# Patient Record
Sex: Female | Born: 1940
Health system: Southern US, Community
[De-identification: ages and names within clinical notes are randomized; demographics above are authoritative.]

## PROBLEM LIST (undated history)

## (undated) DIAGNOSIS — I10 Essential (primary) hypertension: Secondary | ICD-10-CM

## (undated) DIAGNOSIS — N183 Chronic kidney disease, stage 3 unspecified: Secondary | ICD-10-CM

## (undated) DIAGNOSIS — E119 Type 2 diabetes mellitus without complications: Secondary | ICD-10-CM

## (undated) DIAGNOSIS — J302 Other seasonal allergic rhinitis: Secondary | ICD-10-CM

## (undated) DIAGNOSIS — E7800 Pure hypercholesterolemia, unspecified: Secondary | ICD-10-CM

## (undated) DIAGNOSIS — R011 Cardiac murmur, unspecified: Secondary | ICD-10-CM

## (undated) DIAGNOSIS — N200 Calculus of kidney: Secondary | ICD-10-CM

## (undated) DIAGNOSIS — I350 Nonrheumatic aortic (valve) stenosis: Secondary | ICD-10-CM

## (undated) DIAGNOSIS — R87629 Unspecified abnormal cytological findings in specimens from vagina: Secondary | ICD-10-CM

## (undated) HISTORY — DX: Cardiac murmur, unspecified: R01.1

## (undated) HISTORY — DX: Calculus of kidney: N20.0

## (undated) HISTORY — DX: Type 2 diabetes mellitus without complications: E11.9

## (undated) HISTORY — DX: Unspecified abnormal cytological findings in specimens from vagina: R87.629

## (undated) HISTORY — DX: Other seasonal allergic rhinitis: J30.2

## (undated) HISTORY — DX: Chronic kidney disease, stage 3 unspecified: N18.30

## (undated) HISTORY — DX: Essential (primary) hypertension: I10

## (undated) HISTORY — DX: Chronic kidney disease, stage 3 (moderate): N18.3

## (undated) HISTORY — DX: Nonrheumatic aortic (valve) stenosis: I35.0

## (undated) HISTORY — DX: Pure hypercholesterolemia, unspecified: E78.00

---

## 1998-11-22 ENCOUNTER — Other Ambulatory Visit: Admission: RE | Admit: 1998-11-22 | Discharge: 1998-11-22 | Payer: Self-pay | Admitting: *Deleted

## 1999-06-28 ENCOUNTER — Other Ambulatory Visit: Admission: RE | Admit: 1999-06-28 | Discharge: 1999-06-28 | Payer: Self-pay | Admitting: *Deleted

## 2000-08-08 ENCOUNTER — Other Ambulatory Visit: Admission: RE | Admit: 2000-08-08 | Discharge: 2000-08-08 | Payer: Self-pay | Admitting: *Deleted

## 2002-05-05 ENCOUNTER — Other Ambulatory Visit: Admission: RE | Admit: 2002-05-05 | Discharge: 2002-05-05 | Payer: Self-pay | Admitting: *Deleted

## 2003-05-27 ENCOUNTER — Other Ambulatory Visit: Admission: RE | Admit: 2003-05-27 | Discharge: 2003-05-27 | Payer: Self-pay | Admitting: *Deleted

## 2004-07-24 ENCOUNTER — Encounter: Admission: RE | Admit: 2004-07-24 | Discharge: 2004-10-22 | Payer: Self-pay | Admitting: Family Medicine

## 2004-10-12 ENCOUNTER — Other Ambulatory Visit: Admission: RE | Admit: 2004-10-12 | Discharge: 2004-10-12 | Payer: Self-pay | Admitting: Family Medicine

## 2004-11-30 ENCOUNTER — Encounter: Admission: RE | Admit: 2004-11-30 | Discharge: 2005-02-28 | Payer: Self-pay | Admitting: Family Medicine

## 2008-09-19 ENCOUNTER — Emergency Department (HOSPITAL_BASED_OUTPATIENT_CLINIC_OR_DEPARTMENT_OTHER): Admission: EM | Admit: 2008-09-19 | Discharge: 2008-09-19 | Payer: Self-pay | Admitting: Emergency Medicine

## 2008-09-19 ENCOUNTER — Ambulatory Visit: Payer: Self-pay | Admitting: Interventional Radiology

## 2008-09-19 IMAGING — CR DG TIBIA/FIBULA 2V*L*
4 series · 4 of 4 positions shown · non-contrast
Comparison: None

CLINICAL DATA: Fell, leg pain

LEFT TIBIA AND FIBULA - 2 VIEW

[t tib/fib ap left (1 of 2)]
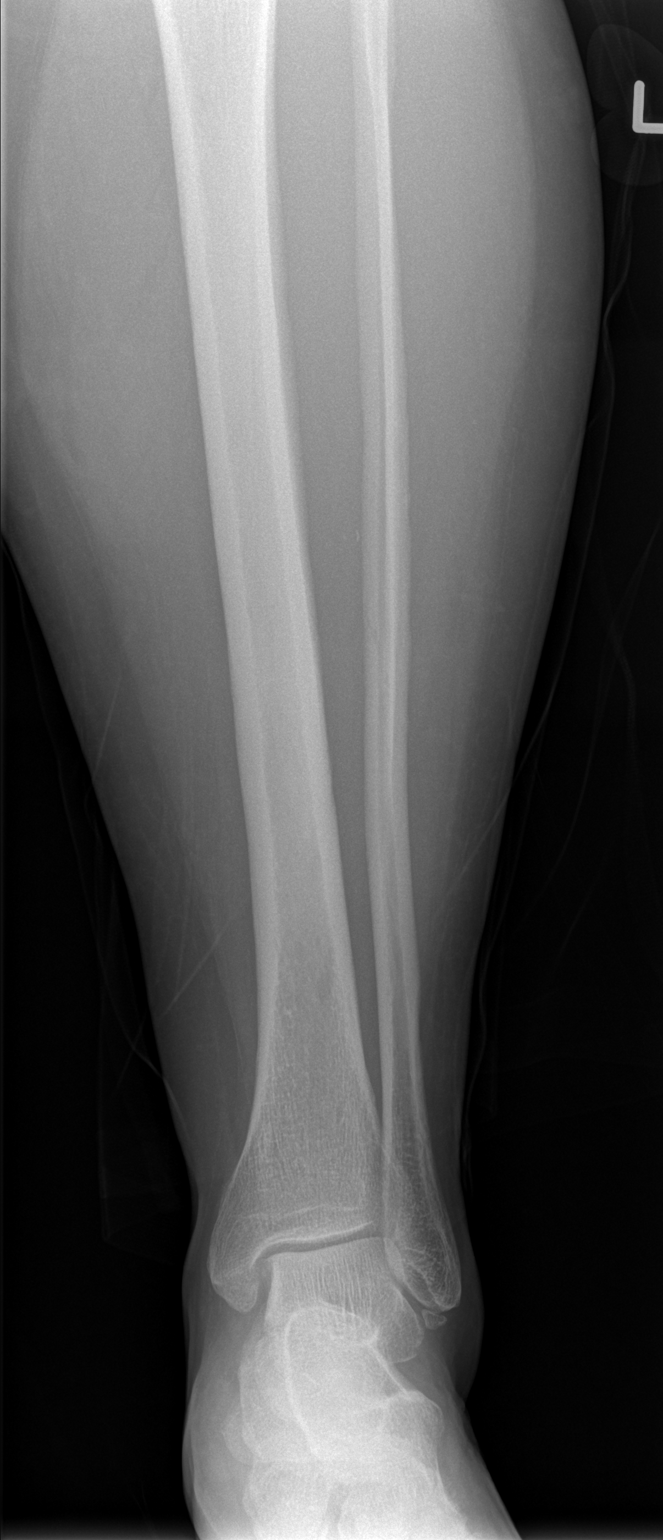

[t tib/fib ap left (2 of 2)]
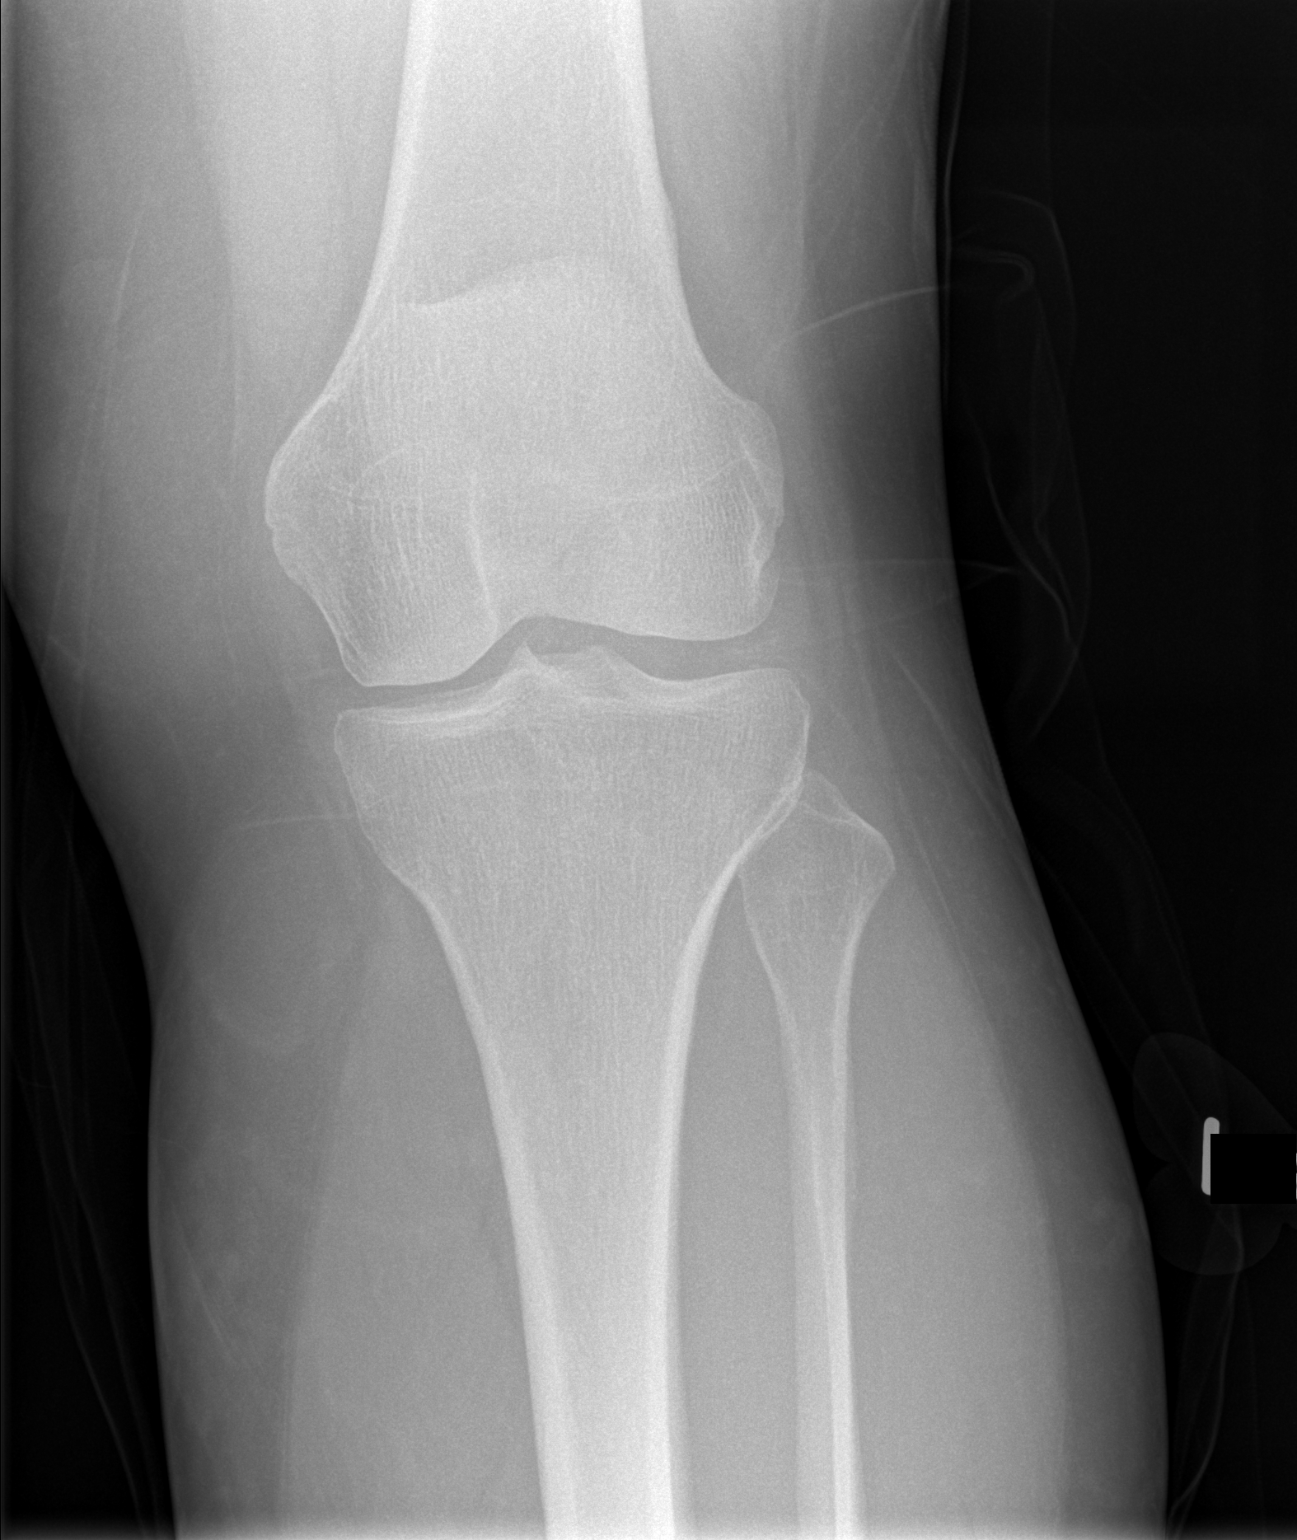

[t tib/fib lat left (1 of 2)]
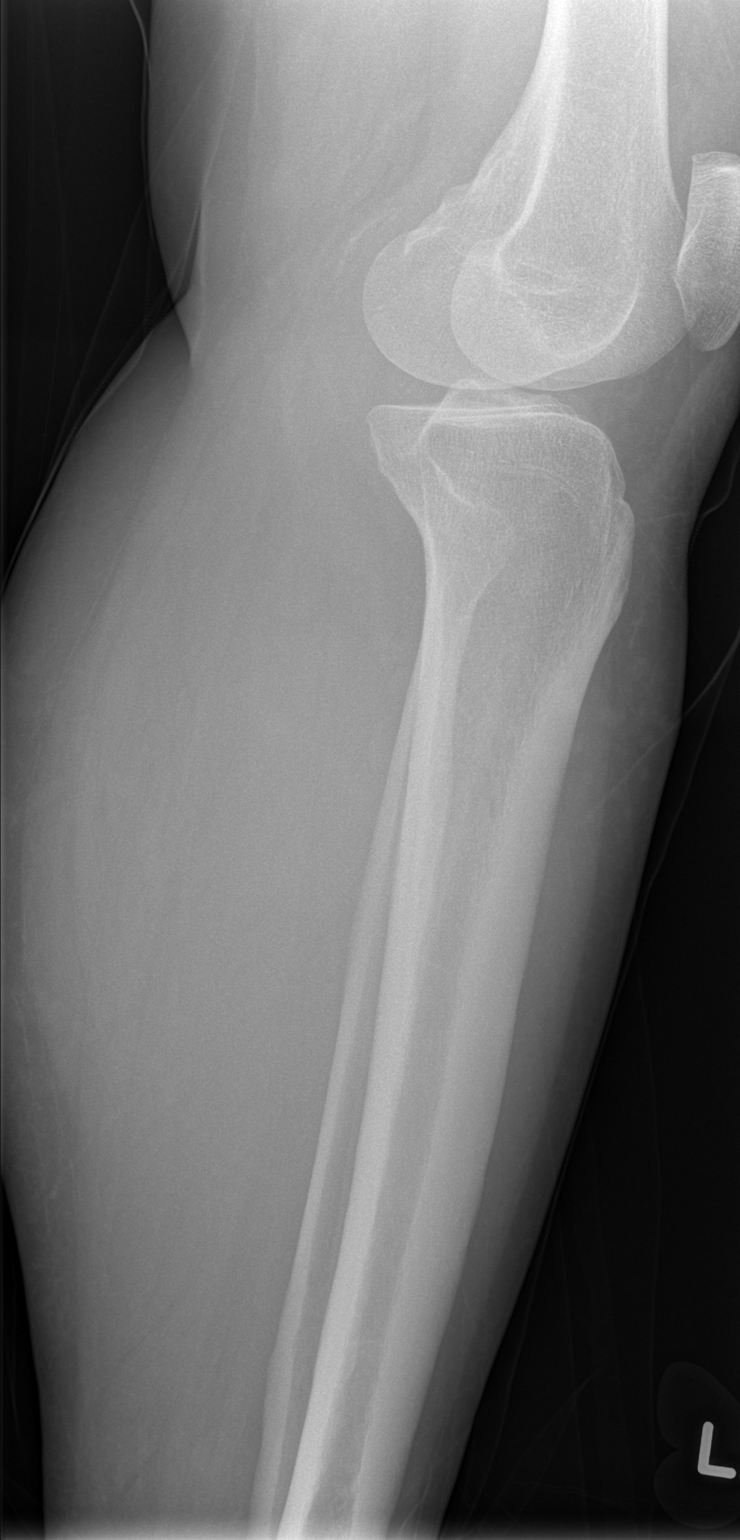

[t tib/fib lat left (2 of 2)]
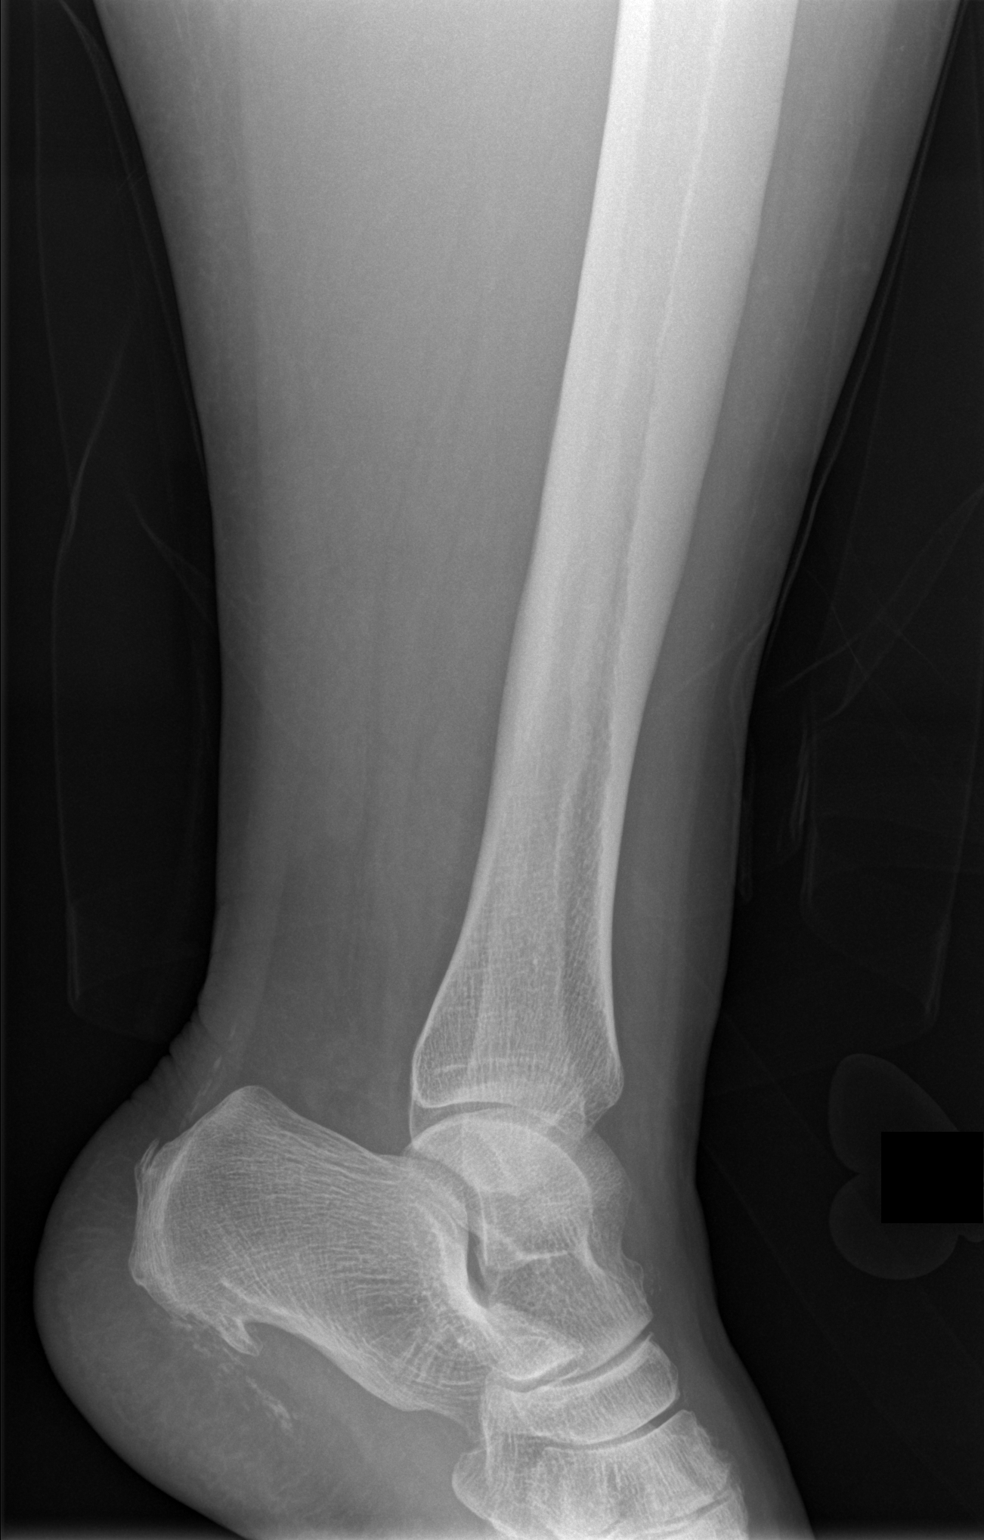

[4 of 4 positions shown; findings below may reference images not displayed]

FINDINGS: There is a corticated ossicle projecting inferior to the
lateral malleolus.  Calcaneal spurs at the plantar aponeurosis and
Achilles tendon with adjacent dystrophic soft tissue
calcifications. Negative for fracture, dislocation, or other acute
abnormality.  Normal alignment and mineralization.
IMPRESSION: Negative for fracture or acute abnormality.

## 2014-08-03 DIAGNOSIS — L659 Nonscarring hair loss, unspecified: Secondary | ICD-10-CM | POA: Diagnosis not present

## 2014-08-03 DIAGNOSIS — E1165 Type 2 diabetes mellitus with hyperglycemia: Secondary | ICD-10-CM | POA: Diagnosis not present

## 2014-08-03 DIAGNOSIS — J309 Allergic rhinitis, unspecified: Secondary | ICD-10-CM | POA: Diagnosis not present

## 2014-08-03 DIAGNOSIS — N200 Calculus of kidney: Secondary | ICD-10-CM | POA: Diagnosis not present

## 2014-08-03 DIAGNOSIS — N183 Chronic kidney disease, stage 3 (moderate): Secondary | ICD-10-CM | POA: Diagnosis not present

## 2014-08-03 DIAGNOSIS — E780 Pure hypercholesterolemia: Secondary | ICD-10-CM | POA: Diagnosis not present

## 2014-08-03 DIAGNOSIS — B351 Tinea unguium: Secondary | ICD-10-CM | POA: Diagnosis not present

## 2014-08-03 DIAGNOSIS — I10 Essential (primary) hypertension: Secondary | ICD-10-CM | POA: Diagnosis not present

## 2014-10-24 DIAGNOSIS — Z0100 Encounter for examination of eyes and vision without abnormal findings: Secondary | ICD-10-CM | POA: Diagnosis not present

## 2014-10-24 DIAGNOSIS — H2513 Age-related nuclear cataract, bilateral: Secondary | ICD-10-CM | POA: Diagnosis not present

## 2014-10-24 DIAGNOSIS — H5210 Myopia, unspecified eye: Secondary | ICD-10-CM | POA: Diagnosis not present

## 2014-10-27 DIAGNOSIS — Z0100 Encounter for examination of eyes and vision without abnormal findings: Secondary | ICD-10-CM | POA: Diagnosis not present

## 2014-11-02 DIAGNOSIS — N183 Chronic kidney disease, stage 3 (moderate): Secondary | ICD-10-CM | POA: Diagnosis not present

## 2014-11-02 DIAGNOSIS — E119 Type 2 diabetes mellitus without complications: Secondary | ICD-10-CM | POA: Diagnosis not present

## 2014-11-02 DIAGNOSIS — E780 Pure hypercholesterolemia: Secondary | ICD-10-CM | POA: Diagnosis not present

## 2014-11-02 DIAGNOSIS — I10 Essential (primary) hypertension: Secondary | ICD-10-CM | POA: Diagnosis not present

## 2015-02-01 DIAGNOSIS — I10 Essential (primary) hypertension: Secondary | ICD-10-CM | POA: Diagnosis not present

## 2015-02-01 DIAGNOSIS — E119 Type 2 diabetes mellitus without complications: Secondary | ICD-10-CM | POA: Diagnosis not present

## 2015-02-01 DIAGNOSIS — N183 Chronic kidney disease, stage 3 (moderate): Secondary | ICD-10-CM | POA: Diagnosis not present

## 2015-02-01 DIAGNOSIS — E780 Pure hypercholesterolemia: Secondary | ICD-10-CM | POA: Diagnosis not present

## 2015-03-05 DIAGNOSIS — Z23 Encounter for immunization: Secondary | ICD-10-CM | POA: Diagnosis not present

## 2015-05-05 DIAGNOSIS — Z1231 Encounter for screening mammogram for malignant neoplasm of breast: Secondary | ICD-10-CM | POA: Diagnosis not present

## 2015-08-05 DIAGNOSIS — E119 Type 2 diabetes mellitus without complications: Secondary | ICD-10-CM | POA: Diagnosis not present

## 2015-08-05 DIAGNOSIS — I10 Essential (primary) hypertension: Secondary | ICD-10-CM | POA: Diagnosis not present

## 2015-08-05 DIAGNOSIS — E7800 Pure hypercholesterolemia, unspecified: Secondary | ICD-10-CM | POA: Diagnosis not present

## 2015-08-05 DIAGNOSIS — L659 Nonscarring hair loss, unspecified: Secondary | ICD-10-CM | POA: Diagnosis not present

## 2015-08-05 DIAGNOSIS — R011 Cardiac murmur, unspecified: Secondary | ICD-10-CM | POA: Diagnosis not present

## 2015-08-08 ENCOUNTER — Other Ambulatory Visit (HOSPITAL_COMMUNITY): Payer: Self-pay | Admitting: Family Medicine

## 2015-08-08 DIAGNOSIS — R011 Cardiac murmur, unspecified: Secondary | ICD-10-CM

## 2015-08-12 ENCOUNTER — Ambulatory Visit (HOSPITAL_COMMUNITY): Payer: Commercial Managed Care - HMO | Attending: Internal Medicine

## 2015-08-12 ENCOUNTER — Other Ambulatory Visit: Payer: Self-pay

## 2015-08-12 DIAGNOSIS — I10 Essential (primary) hypertension: Secondary | ICD-10-CM | POA: Insufficient documentation

## 2015-08-12 DIAGNOSIS — I517 Cardiomegaly: Secondary | ICD-10-CM | POA: Insufficient documentation

## 2015-08-12 DIAGNOSIS — R011 Cardiac murmur, unspecified: Secondary | ICD-10-CM

## 2015-08-12 DIAGNOSIS — E785 Hyperlipidemia, unspecified: Secondary | ICD-10-CM | POA: Diagnosis not present

## 2015-08-12 DIAGNOSIS — I340 Nonrheumatic mitral (valve) insufficiency: Secondary | ICD-10-CM | POA: Diagnosis not present

## 2015-08-12 DIAGNOSIS — I358 Other nonrheumatic aortic valve disorders: Secondary | ICD-10-CM | POA: Diagnosis not present

## 2015-08-19 ENCOUNTER — Other Ambulatory Visit (HOSPITAL_COMMUNITY): Payer: Self-pay

## 2015-11-11 DIAGNOSIS — E7800 Pure hypercholesterolemia, unspecified: Secondary | ICD-10-CM | POA: Diagnosis not present

## 2016-02-03 DIAGNOSIS — I10 Essential (primary) hypertension: Secondary | ICD-10-CM | POA: Diagnosis not present

## 2016-02-03 DIAGNOSIS — E7800 Pure hypercholesterolemia, unspecified: Secondary | ICD-10-CM | POA: Diagnosis not present

## 2016-02-03 DIAGNOSIS — N183 Chronic kidney disease, stage 3 (moderate): Secondary | ICD-10-CM | POA: Diagnosis not present

## 2016-02-03 DIAGNOSIS — E119 Type 2 diabetes mellitus without complications: Secondary | ICD-10-CM | POA: Diagnosis not present

## 2016-02-03 DIAGNOSIS — Z23 Encounter for immunization: Secondary | ICD-10-CM | POA: Diagnosis not present

## 2016-05-07 DIAGNOSIS — Z1231 Encounter for screening mammogram for malignant neoplasm of breast: Secondary | ICD-10-CM | POA: Diagnosis not present

## 2016-08-08 DIAGNOSIS — E7800 Pure hypercholesterolemia, unspecified: Secondary | ICD-10-CM | POA: Diagnosis not present

## 2016-08-08 DIAGNOSIS — I10 Essential (primary) hypertension: Secondary | ICD-10-CM | POA: Diagnosis not present

## 2016-08-08 DIAGNOSIS — E119 Type 2 diabetes mellitus without complications: Secondary | ICD-10-CM | POA: Diagnosis not present

## 2016-11-02 DIAGNOSIS — K625 Hemorrhage of anus and rectum: Secondary | ICD-10-CM | POA: Diagnosis not present

## 2016-12-22 DIAGNOSIS — J069 Acute upper respiratory infection, unspecified: Secondary | ICD-10-CM | POA: Diagnosis not present

## 2017-02-12 DIAGNOSIS — I10 Essential (primary) hypertension: Secondary | ICD-10-CM | POA: Diagnosis not present

## 2017-02-12 DIAGNOSIS — E119 Type 2 diabetes mellitus without complications: Secondary | ICD-10-CM | POA: Diagnosis not present

## 2017-02-12 DIAGNOSIS — N183 Chronic kidney disease, stage 3 (moderate): Secondary | ICD-10-CM | POA: Diagnosis not present

## 2017-02-12 DIAGNOSIS — E7800 Pure hypercholesterolemia, unspecified: Secondary | ICD-10-CM | POA: Diagnosis not present

## 2017-04-28 DIAGNOSIS — M5489 Other dorsalgia: Secondary | ICD-10-CM | POA: Diagnosis not present

## 2017-05-25 DIAGNOSIS — M25641 Stiffness of right hand, not elsewhere classified: Secondary | ICD-10-CM | POA: Diagnosis not present

## 2017-06-14 DIAGNOSIS — L659 Nonscarring hair loss, unspecified: Secondary | ICD-10-CM | POA: Diagnosis not present

## 2017-06-14 DIAGNOSIS — Z1231 Encounter for screening mammogram for malignant neoplasm of breast: Secondary | ICD-10-CM | POA: Diagnosis not present

## 2017-06-14 DIAGNOSIS — L308 Other specified dermatitis: Secondary | ICD-10-CM | POA: Diagnosis not present

## 2017-06-28 DIAGNOSIS — L308 Other specified dermatitis: Secondary | ICD-10-CM | POA: Diagnosis not present

## 2017-06-28 DIAGNOSIS — M79642 Pain in left hand: Secondary | ICD-10-CM | POA: Diagnosis not present

## 2017-06-28 DIAGNOSIS — M79641 Pain in right hand: Secondary | ICD-10-CM | POA: Diagnosis not present

## 2017-07-18 ENCOUNTER — Telehealth: Payer: Self-pay

## 2017-07-18 DIAGNOSIS — M545 Low back pain: Secondary | ICD-10-CM | POA: Diagnosis not present

## 2017-07-18 DIAGNOSIS — M79641 Pain in right hand: Secondary | ICD-10-CM | POA: Diagnosis not present

## 2017-07-18 DIAGNOSIS — L309 Dermatitis, unspecified: Secondary | ICD-10-CM | POA: Diagnosis not present

## 2017-07-18 DIAGNOSIS — M79642 Pain in left hand: Secondary | ICD-10-CM | POA: Diagnosis not present

## 2017-07-18 DIAGNOSIS — R0789 Other chest pain: Secondary | ICD-10-CM | POA: Diagnosis not present

## 2017-07-18 NOTE — Telephone Encounter (Signed)
Sent notes to scheduling 

## 2017-07-22 ENCOUNTER — Other Ambulatory Visit: Payer: Self-pay | Admitting: Family Medicine

## 2017-07-22 ENCOUNTER — Ambulatory Visit
Admission: RE | Admit: 2017-07-22 | Discharge: 2017-07-22 | Disposition: A | Discharge status: Home / Self Care | Payer: Commercial Managed Care - HMO | Source: Ambulatory Visit | Attending: Family Medicine | Admitting: Family Medicine

## 2017-07-22 ENCOUNTER — Telehealth: Payer: Self-pay

## 2017-07-22 DIAGNOSIS — M79642 Pain in left hand: Secondary | ICD-10-CM

## 2017-07-22 DIAGNOSIS — M5136 Other intervertebral disc degeneration, lumbar region: Secondary | ICD-10-CM | POA: Diagnosis not present

## 2017-07-22 DIAGNOSIS — M545 Low back pain, unspecified: Secondary | ICD-10-CM

## 2017-07-22 DIAGNOSIS — M189 Osteoarthritis of first carpometacarpal joint, unspecified: Secondary | ICD-10-CM | POA: Diagnosis not present

## 2017-07-22 DIAGNOSIS — M19041 Primary osteoarthritis, right hand: Secondary | ICD-10-CM | POA: Diagnosis not present

## 2017-07-22 DIAGNOSIS — M79641 Pain in right hand: Secondary | ICD-10-CM

## 2017-07-22 NOTE — Telephone Encounter (Signed)
SENT REFERRAL TO SCHEDULING 

## 2017-07-25 DIAGNOSIS — M7099 Unspecified soft tissue disorder related to use, overuse and pressure multiple sites: Secondary | ICD-10-CM | POA: Diagnosis not present

## 2017-07-25 DIAGNOSIS — M545 Low back pain: Secondary | ICD-10-CM | POA: Diagnosis not present

## 2017-07-25 DIAGNOSIS — R262 Difficulty in walking, not elsewhere classified: Secondary | ICD-10-CM | POA: Diagnosis not present

## 2017-07-25 DIAGNOSIS — R293 Abnormal posture: Secondary | ICD-10-CM | POA: Diagnosis not present

## 2017-07-30 DIAGNOSIS — R293 Abnormal posture: Secondary | ICD-10-CM | POA: Diagnosis not present

## 2017-07-30 DIAGNOSIS — M545 Low back pain: Secondary | ICD-10-CM | POA: Diagnosis not present

## 2017-07-30 DIAGNOSIS — M7099 Unspecified soft tissue disorder related to use, overuse and pressure multiple sites: Secondary | ICD-10-CM | POA: Diagnosis not present

## 2017-07-30 DIAGNOSIS — R262 Difficulty in walking, not elsewhere classified: Secondary | ICD-10-CM | POA: Diagnosis not present

## 2017-08-01 DIAGNOSIS — M545 Low back pain: Secondary | ICD-10-CM | POA: Diagnosis not present

## 2017-08-01 DIAGNOSIS — M7099 Unspecified soft tissue disorder related to use, overuse and pressure multiple sites: Secondary | ICD-10-CM | POA: Diagnosis not present

## 2017-08-01 DIAGNOSIS — R262 Difficulty in walking, not elsewhere classified: Secondary | ICD-10-CM | POA: Diagnosis not present

## 2017-08-01 DIAGNOSIS — R293 Abnormal posture: Secondary | ICD-10-CM | POA: Diagnosis not present

## 2017-08-05 DIAGNOSIS — R293 Abnormal posture: Secondary | ICD-10-CM | POA: Diagnosis not present

## 2017-08-05 DIAGNOSIS — R262 Difficulty in walking, not elsewhere classified: Secondary | ICD-10-CM | POA: Diagnosis not present

## 2017-08-05 DIAGNOSIS — M7099 Unspecified soft tissue disorder related to use, overuse and pressure multiple sites: Secondary | ICD-10-CM | POA: Diagnosis not present

## 2017-08-05 DIAGNOSIS — M545 Low back pain: Secondary | ICD-10-CM | POA: Diagnosis not present

## 2017-08-06 ENCOUNTER — Other Ambulatory Visit: Payer: Self-pay

## 2017-08-06 DIAGNOSIS — N200 Calculus of kidney: Secondary | ICD-10-CM | POA: Insufficient documentation

## 2017-08-06 DIAGNOSIS — E119 Type 2 diabetes mellitus without complications: Secondary | ICD-10-CM | POA: Insufficient documentation

## 2017-08-06 DIAGNOSIS — J302 Other seasonal allergic rhinitis: Secondary | ICD-10-CM | POA: Insufficient documentation

## 2017-08-06 DIAGNOSIS — R262 Difficulty in walking, not elsewhere classified: Secondary | ICD-10-CM | POA: Diagnosis not present

## 2017-08-06 DIAGNOSIS — M7099 Unspecified soft tissue disorder related to use, overuse and pressure multiple sites: Secondary | ICD-10-CM | POA: Diagnosis not present

## 2017-08-06 DIAGNOSIS — M545 Low back pain: Secondary | ICD-10-CM | POA: Diagnosis not present

## 2017-08-06 DIAGNOSIS — R87629 Unspecified abnormal cytological findings in specimens from vagina: Secondary | ICD-10-CM | POA: Insufficient documentation

## 2017-08-06 DIAGNOSIS — E7800 Pure hypercholesterolemia, unspecified: Secondary | ICD-10-CM | POA: Insufficient documentation

## 2017-08-06 DIAGNOSIS — R293 Abnormal posture: Secondary | ICD-10-CM | POA: Diagnosis not present

## 2017-08-06 DIAGNOSIS — R0789 Other chest pain: Secondary | ICD-10-CM | POA: Insufficient documentation

## 2017-08-06 DIAGNOSIS — I10 Essential (primary) hypertension: Secondary | ICD-10-CM | POA: Insufficient documentation

## 2017-08-06 DIAGNOSIS — N183 Chronic kidney disease, stage 3 unspecified: Secondary | ICD-10-CM | POA: Insufficient documentation

## 2017-08-08 DIAGNOSIS — M7099 Unspecified soft tissue disorder related to use, overuse and pressure multiple sites: Secondary | ICD-10-CM | POA: Diagnosis not present

## 2017-08-08 DIAGNOSIS — R293 Abnormal posture: Secondary | ICD-10-CM | POA: Diagnosis not present

## 2017-08-08 DIAGNOSIS — R262 Difficulty in walking, not elsewhere classified: Secondary | ICD-10-CM | POA: Diagnosis not present

## 2017-08-08 DIAGNOSIS — M545 Low back pain: Secondary | ICD-10-CM | POA: Diagnosis not present

## 2017-08-12 ENCOUNTER — Other Ambulatory Visit: Payer: Self-pay

## 2017-08-12 ENCOUNTER — Encounter: Payer: Self-pay | Admitting: Cardiology

## 2017-08-12 ENCOUNTER — Ambulatory Visit: Payer: Medicare HMO | Admitting: Cardiology

## 2017-08-12 ENCOUNTER — Ambulatory Visit (HOSPITAL_COMMUNITY): Payer: Medicare HMO | Attending: Cardiology

## 2017-08-12 ENCOUNTER — Encounter (INDEPENDENT_AMBULATORY_CARE_PROVIDER_SITE_OTHER): Payer: Self-pay

## 2017-08-12 VITALS — HR 72 | Ht 62.0 in | Wt 126.0 lb

## 2017-08-12 DIAGNOSIS — I350 Nonrheumatic aortic (valve) stenosis: Secondary | ICD-10-CM

## 2017-08-12 DIAGNOSIS — M7989 Other specified soft tissue disorders: Secondary | ICD-10-CM | POA: Diagnosis not present

## 2017-08-12 DIAGNOSIS — R079 Chest pain, unspecified: Secondary | ICD-10-CM

## 2017-08-12 DIAGNOSIS — I5030 Unspecified diastolic (congestive) heart failure: Secondary | ICD-10-CM | POA: Diagnosis not present

## 2017-08-12 DIAGNOSIS — I083 Combined rheumatic disorders of mitral, aortic and tricuspid valves: Secondary | ICD-10-CM | POA: Insufficient documentation

## 2017-08-12 DIAGNOSIS — I10 Essential (primary) hypertension: Secondary | ICD-10-CM

## 2017-08-12 HISTORY — DX: Nonrheumatic aortic (valve) stenosis: I35.0

## 2017-08-12 LAB — ECHOCARDIOGRAM COMPLETE
Height: 62 in
WEIGHTICAEL: 2016 [oz_av]

## 2017-08-12 NOTE — Progress Notes (Signed)
Cardiology Office Note    Date:  08/12/2017   ID:  Isabel Huff, DOB 11/03/1940, MRN [id number]  PCP:  Johny BlamerHarris, William, MD  Cardiologist:  Armanda Magicraci Trishelle Devora, MD   Chief Complaint  Patient presents with  . New Patient (Initial Visit)    chest pain     History of Present Illness:  Isabel CharlesVilma R Myrie is a 77 y.o. female who is being seen today for the evaluation of chest pain at the request of Johny BlamerHarris, William, MD.  This is a 77yo female with a history of type 2DM, HTN, hyperlipidemia, CKD stage 3, mild AS and mild MR who has been having chest pain.  This occurred earlier in the month while walking and was described as pressure.  There was no associated SOB, nausea or diaphoresis.  She has also noticed reduced exercise endurance and gets easily fatigued with walking up hills near her home.  She has had several episodes of chest pain recently with going up stairs which goes away with rest.   She has never smoked.  A recent EKG showed NSR at 100bpm with anterior infarct and nonspecific T wave abnormality.  She denies any DOE, PND, orthopnea, LE edema, dizziness, palpitations or syncope.     Current Medications: Current Meds  Medication Sig  . atorvastatin (LIPITOR) 20 MG tablet Take 1 tablet by mouth daily.  . halobetasol (ULTRAVATE) 0.05 % cream Apply 1 application topically as directed.   Marland Kitchen. losartan-hydrochlorothiazide (HYZAAR) 50-12.5 MG tablet Take 1 tablet by mouth daily.  Marland Kitchen. tiZANidine (ZANAFLEX) 2 MG tablet Take 1 tablet by mouth 2 (two) times daily as needed for muscle spasms.     Allergies:   Crestor [rosuvastatin]; Demerol [meperidine]; Lovaza [omega-3-acid ethyl esters]; Metformin and related; Pravastatin; Simvastatin; Sulfa antibiotics; and Latex   Social History   Socioeconomic History  . Marital status: Married    Spouse name: None  . Number of children: None  . Years of education: None  . Highest education level: None  Social Needs  . Financial resource strain: None    . Food insecurity - worry: None  . Food insecurity - inability: None  . Transportation needs - medical: None  . Transportation needs - non-medical: None  Occupational History  . Occupation: Childcare     Comment: At [employer]   Tobacco Use  . Smoking status: Never Smoker  . Smokeless tobacco: Never Used  Substance and Sexual Activity  . Alcohol use: No    Frequency: Never  . Drug use: No  . Sexual activity: None  Other Topics Concern  . None  Social History Narrative  . None     Family History:  The patient's family history includes Alzheimer's disease in her father; Hyperlipidemia in her sister; Hypertension in her sister.   ROS:   Please see the history of present illness.    ROS All other systems reviewed and are negative.  No flowsheet data found.   PHYSICAL EXAM:   VS:  Pulse 72   Ht 5\' 2"  (1.575 m)   Wt 126 lb (57.2 kg)   BMI 23.05 kg/m    GEN: Well nourished, well developed, in no acute distress  HEENT: normal  Neck: no JVD, carotid bruits, or masses Cardiac: RRR; no murmurs, rubs, or gallops,no edema.  Intact distal pulses bilaterally.  Respiratory:  clear to auscultation bilaterally, normal work of breathing GI: soft, nontender, nondistended, + BS MS: no deformity or atrophy  Skin: warm and dry, no rash  Neuro:  Alert and Oriented x 3, Strength and sensation are intact Psych: euthymic mood, full affect  Wt Readings from Last 3 Encounters:  08/12/17 126 lb (57.2 kg)      Studies/Labs Reviewed:   EKG:  EKG is ordered today and showed NSR with PACs at 103 bpm.    Recent Labs: No results found for requested labs within last 8760 hours.   Lipid Panel No results found for: CHOL, TRIG, HDL, CHOLHDL, VLDL, LDLCALC, LDLDIRECT  Additional studies/ records that were reviewed today include:  Office notes from PCP    ASSESSMENT:    1. Chest pain, unspecified type   2. Essential hypertension   3. Nonrheumatic aortic valve stenosis   4. Bilateral  hand swelling      PLAN:  In order of problems listed above:  1.  Chest pain - her CP is somewhat atypical in that is is not associated with any other sx and does not radiate.  Her CRF include postmenopausal state, HTN and hyperlipidemia. I will get a Lexiscam myoview to rule out iscemia.    2.  HTN - her BP is well controlled on exam today.  She will continue on Losartan HCT 50-12.5mg  daily.    3.  Mild AS by echo 2 years ago with mean AVG of . I will repeat an echo to make sure this has not progressed.    4.  Finger blisters - I am concerned that she may have some type of microvascular disease or rheumatologic disorder due the findings on her fingers.  She has seen 2 dermatologist and neither had any idea what it was.  She has blisters on the tips of her fingers.  She is not a smoker so unlikely to be Bergers disease.  Her fingers are swollen as well.  I am going to refer her to Dr. Lendon Colonel with Rheumatology for further evaulation. I will check a sed rate, CRP, ANA and RF.    Medication Adjustments/Labs and Tests Ordered: Current medicines are reviewed at length with the patient today.  Concerns regarding medicines are outlined above.  Medication changes, Labs and Tests ordered today are listed in the Patient Instructions below.  There are no Patient Instructions on file for this visit.   Signed, Armanda Magic, MD  08/12/2017 2:29 PM    [facility] [address] [place], [place], [place]  [address] Phone: [phone]; Fax: [phone]

## 2017-08-12 NOTE — Patient Instructions (Signed)
Medication Instructions:  Your physician recommends that you continue on your current medications as directed. Please refer to the Current Medication list given to you today.  Labwork: Today for CRP, sedimentation rate, ANA, and rheumatoid factor   Testing/Procedures: Your physician has requested that you have an echocardiogram. Echocardiography is a painless test that uses sound waves to create images of your heart. It provides your doctor with information about the size and shape of your heart and how well your heart's chambers and valves are working. This procedure takes approximately one hour. There are no restrictions for this procedure.  Your physician has requested that you have a lexiscan myoview. For further information please visit [email or web address]. Please follow instruction sheet, as given.   Follow-Up: Your physician wants you to follow-up in: 1 year with Dr. Mayford Knifeurner. You will receive a reminder letter in the mail two months in advance. If you don't receive a letter, please call our office to schedule the follow-up appointment.   Any Other Special Instructions Will Be Listed Below (If Applicable). Your physician has referred you to Dr. Zenovia JordanAngela Hawkes, rheumatologist for your hand swelling and decreased pulses.     If you need a refill on your cardiac medications before your next appointment, please call your pharmacy.

## 2017-08-13 DIAGNOSIS — M545 Low back pain: Secondary | ICD-10-CM | POA: Diagnosis not present

## 2017-08-13 DIAGNOSIS — M7099 Unspecified soft tissue disorder related to use, overuse and pressure multiple sites: Secondary | ICD-10-CM | POA: Diagnosis not present

## 2017-08-13 DIAGNOSIS — R293 Abnormal posture: Secondary | ICD-10-CM | POA: Diagnosis not present

## 2017-08-13 DIAGNOSIS — R262 Difficulty in walking, not elsewhere classified: Secondary | ICD-10-CM | POA: Diagnosis not present

## 2017-08-13 LAB — RHEUMATOID FACTOR: Rhuematoid fact SerPl-aCnc: <10 IU/mL (ref 0.0–13.9)

## 2017-08-13 LAB — C-REACTIVE PROTEIN: CRP: 2.1 mg/L (ref 0.0–4.9)

## 2017-08-13 LAB — ANA: Anti Nuclear Antibody(ANA): NEGATIVE

## 2017-08-13 LAB — SEDIMENTATION RATE: SED RATE: 32 mm/h (ref 0–40)

## 2017-08-15 ENCOUNTER — Telehealth: Payer: Self-pay

## 2017-08-15 DIAGNOSIS — R5383 Other fatigue: Secondary | ICD-10-CM | POA: Diagnosis not present

## 2017-08-15 DIAGNOSIS — Z6822 Body mass index (BMI) 22.0-22.9, adult: Secondary | ICD-10-CM | POA: Diagnosis not present

## 2017-08-15 DIAGNOSIS — R262 Difficulty in walking, not elsewhere classified: Secondary | ICD-10-CM | POA: Diagnosis not present

## 2017-08-15 DIAGNOSIS — I350 Nonrheumatic aortic (valve) stenosis: Secondary | ICD-10-CM

## 2017-08-15 DIAGNOSIS — I7301 Raynaud's syndrome with gangrene: Secondary | ICD-10-CM | POA: Diagnosis not present

## 2017-08-15 DIAGNOSIS — M349 Systemic sclerosis, unspecified: Secondary | ICD-10-CM | POA: Diagnosis not present

## 2017-08-15 DIAGNOSIS — M545 Low back pain: Secondary | ICD-10-CM | POA: Diagnosis not present

## 2017-08-15 DIAGNOSIS — R293 Abnormal posture: Secondary | ICD-10-CM | POA: Diagnosis not present

## 2017-08-15 DIAGNOSIS — M7989 Other specified soft tissue disorders: Secondary | ICD-10-CM | POA: Diagnosis not present

## 2017-08-15 DIAGNOSIS — L98499 Non-pressure chronic ulcer of skin of other sites with unspecified severity: Secondary | ICD-10-CM | POA: Diagnosis not present

## 2017-08-15 DIAGNOSIS — M7099 Unspecified soft tissue disorder related to use, overuse and pressure multiple sites: Secondary | ICD-10-CM | POA: Diagnosis not present

## 2017-08-15 NOTE — Telephone Encounter (Signed)
Notes recorded by Phineas Semenobertson, Savhanna Sliva, RN on 08/15/2017 at 10:36 AM EST Patient made aware of results. Patient verbalizes understanding and thankful for the call. Echo ordered to be scheduled 07/2018   Notes recorded by Quintella Reicherturner, Traci R, MD on 08/13/2017 at 12:59 PM EST Echo showed mild LVH with normal LVF, moderate AS. Repeat echo in 1 year

## 2017-08-20 DIAGNOSIS — N183 Chronic kidney disease, stage 3 (moderate): Secondary | ICD-10-CM | POA: Diagnosis not present

## 2017-08-20 DIAGNOSIS — Z1211 Encounter for screening for malignant neoplasm of colon: Secondary | ICD-10-CM | POA: Diagnosis not present

## 2017-08-20 DIAGNOSIS — M349 Systemic sclerosis, unspecified: Secondary | ICD-10-CM | POA: Diagnosis not present

## 2017-08-20 DIAGNOSIS — E119 Type 2 diabetes mellitus without complications: Secondary | ICD-10-CM | POA: Diagnosis not present

## 2017-08-20 DIAGNOSIS — M545 Low back pain: Secondary | ICD-10-CM | POA: Diagnosis not present

## 2017-08-20 DIAGNOSIS — I10 Essential (primary) hypertension: Secondary | ICD-10-CM | POA: Diagnosis not present

## 2017-08-20 DIAGNOSIS — E7800 Pure hypercholesterolemia, unspecified: Secondary | ICD-10-CM | POA: Diagnosis not present

## 2017-08-20 DIAGNOSIS — M7099 Unspecified soft tissue disorder related to use, overuse and pressure multiple sites: Secondary | ICD-10-CM | POA: Diagnosis not present

## 2017-08-20 DIAGNOSIS — I7301 Raynaud's syndrome with gangrene: Secondary | ICD-10-CM | POA: Diagnosis not present

## 2017-08-20 DIAGNOSIS — R262 Difficulty in walking, not elsewhere classified: Secondary | ICD-10-CM | POA: Diagnosis not present

## 2017-08-20 DIAGNOSIS — R293 Abnormal posture: Secondary | ICD-10-CM | POA: Diagnosis not present

## 2017-08-21 ENCOUNTER — Other Ambulatory Visit: Payer: Self-pay

## 2017-08-21 DIAGNOSIS — I7300 Raynaud's syndrome without gangrene: Secondary | ICD-10-CM

## 2017-08-22 ENCOUNTER — Telehealth (HOSPITAL_COMMUNITY): Payer: Self-pay | Admitting: *Deleted

## 2017-08-22 DIAGNOSIS — M7099 Unspecified soft tissue disorder related to use, overuse and pressure multiple sites: Secondary | ICD-10-CM | POA: Diagnosis not present

## 2017-08-22 DIAGNOSIS — R293 Abnormal posture: Secondary | ICD-10-CM | POA: Diagnosis not present

## 2017-08-22 DIAGNOSIS — M545 Low back pain: Secondary | ICD-10-CM | POA: Diagnosis not present

## 2017-08-22 DIAGNOSIS — R262 Difficulty in walking, not elsewhere classified: Secondary | ICD-10-CM | POA: Diagnosis not present

## 2017-08-22 NOTE — Telephone Encounter (Signed)
Patient given detailed instructions per Myocardial Perfusion Study Information Sheet for the test on 08/27/17. Patient notified to arrive 15 minutes early and that it is imperative to arrive on time for appointment to keep from having the test rescheduled.  If you need to cancel or reschedule your appointment, please call the office within 24 hours of your appointment. . Patient verbalized understanding. Isabel Huff Isabel Huff    

## 2017-08-27 ENCOUNTER — Ambulatory Visit (HOSPITAL_COMMUNITY): Payer: Medicare HMO | Attending: Cardiovascular Disease

## 2017-08-27 ENCOUNTER — Encounter (INDEPENDENT_AMBULATORY_CARE_PROVIDER_SITE_OTHER): Payer: Self-pay

## 2017-08-27 DIAGNOSIS — R262 Difficulty in walking, not elsewhere classified: Secondary | ICD-10-CM | POA: Diagnosis not present

## 2017-08-27 DIAGNOSIS — M7099 Unspecified soft tissue disorder related to use, overuse and pressure multiple sites: Secondary | ICD-10-CM | POA: Diagnosis not present

## 2017-08-27 DIAGNOSIS — R0609 Other forms of dyspnea: Secondary | ICD-10-CM | POA: Insufficient documentation

## 2017-08-27 DIAGNOSIS — I10 Essential (primary) hypertension: Secondary | ICD-10-CM | POA: Insufficient documentation

## 2017-08-27 DIAGNOSIS — I350 Nonrheumatic aortic (valve) stenosis: Secondary | ICD-10-CM | POA: Diagnosis not present

## 2017-08-27 DIAGNOSIS — E119 Type 2 diabetes mellitus without complications: Secondary | ICD-10-CM | POA: Insufficient documentation

## 2017-08-27 DIAGNOSIS — R5383 Other fatigue: Secondary | ICD-10-CM | POA: Insufficient documentation

## 2017-08-27 DIAGNOSIS — R293 Abnormal posture: Secondary | ICD-10-CM | POA: Diagnosis not present

## 2017-08-27 DIAGNOSIS — M545 Low back pain: Secondary | ICD-10-CM | POA: Diagnosis not present

## 2017-08-27 DIAGNOSIS — R0789 Other chest pain: Secondary | ICD-10-CM | POA: Insufficient documentation

## 2017-08-27 DIAGNOSIS — R079 Chest pain, unspecified: Secondary | ICD-10-CM

## 2017-08-27 LAB — MYOCARDIAL PERFUSION IMAGING
CHL CUP NUCLEAR SDS: 2
CHL CUP NUCLEAR SSS: 5
CSEPPHR: 130 {beats}/min
RATE: 0.34
Rest HR: 116 {beats}/min
SRS: 3
TID: 1.07

## 2017-08-27 MED ORDER — TECHNETIUM TC 99M TETROFOSMIN IV KIT
32.0000 | PACK | Freq: Once | INTRAVENOUS | Status: AC | PRN
  Administered 2017-08-27: 32 via INTRAVENOUS
  Filled 2017-08-27: qty 32

## 2017-08-27 MED ORDER — TECHNETIUM TC 99M TETROFOSMIN IV KIT
10.1000 | PACK | Freq: Once | INTRAVENOUS | Status: AC | PRN
  Administered 2017-08-27: 10.1 via INTRAVENOUS
  Filled 2017-08-27: qty 11

## 2017-08-27 MED ORDER — REGADENOSON 0.4 MG/5ML IV SOLN
0.4000 mg | Freq: Once | INTRAVENOUS | Status: AC
  Administered 2017-08-27: 0.4 mg via INTRAVENOUS

## 2017-08-29 DIAGNOSIS — R262 Difficulty in walking, not elsewhere classified: Secondary | ICD-10-CM | POA: Diagnosis not present

## 2017-08-29 DIAGNOSIS — R293 Abnormal posture: Secondary | ICD-10-CM | POA: Diagnosis not present

## 2017-08-29 DIAGNOSIS — M545 Low back pain: Secondary | ICD-10-CM | POA: Diagnosis not present

## 2017-08-29 DIAGNOSIS — M7099 Unspecified soft tissue disorder related to use, overuse and pressure multiple sites: Secondary | ICD-10-CM | POA: Diagnosis not present

## 2017-09-11 DIAGNOSIS — I7301 Raynaud's syndrome with gangrene: Secondary | ICD-10-CM | POA: Diagnosis not present

## 2017-09-11 DIAGNOSIS — R410 Disorientation, unspecified: Secondary | ICD-10-CM | POA: Diagnosis not present

## 2017-09-11 DIAGNOSIS — R945 Abnormal results of liver function studies: Secondary | ICD-10-CM | POA: Diagnosis not present

## 2017-09-11 DIAGNOSIS — Z6822 Body mass index (BMI) 22.0-22.9, adult: Secondary | ICD-10-CM | POA: Diagnosis not present

## 2017-09-11 DIAGNOSIS — M7989 Other specified soft tissue disorders: Secondary | ICD-10-CM | POA: Diagnosis not present

## 2017-09-11 DIAGNOSIS — M349 Systemic sclerosis, unspecified: Secondary | ICD-10-CM | POA: Diagnosis not present

## 2017-09-11 DIAGNOSIS — G3184 Mild cognitive impairment, so stated: Secondary | ICD-10-CM | POA: Diagnosis not present

## 2017-09-11 DIAGNOSIS — L98499 Non-pressure chronic ulcer of skin of other sites with unspecified severity: Secondary | ICD-10-CM | POA: Diagnosis not present

## 2017-09-17 ENCOUNTER — Encounter: Payer: Medicare HMO | Admitting: Vascular Surgery

## 2017-09-17 ENCOUNTER — Other Ambulatory Visit (HOSPITAL_COMMUNITY): Payer: Medicare HMO

## 2017-09-25 DIAGNOSIS — M5382 Other specified dorsopathies, cervical region: Secondary | ICD-10-CM | POA: Diagnosis not present

## 2017-09-25 DIAGNOSIS — I10 Essential (primary) hypertension: Secondary | ICD-10-CM | POA: Diagnosis not present

## 2017-09-27 ENCOUNTER — Other Ambulatory Visit: Payer: Self-pay | Admitting: Family Medicine

## 2017-09-27 ENCOUNTER — Ambulatory Visit
Admission: RE | Admit: 2017-09-27 | Discharge: 2017-09-27 | Disposition: A | Discharge status: Home / Self Care | Payer: Medicare HMO | Source: Ambulatory Visit | Attending: Family Medicine | Admitting: Family Medicine

## 2017-09-27 DIAGNOSIS — M5382 Other specified dorsopathies, cervical region: Secondary | ICD-10-CM

## 2017-09-27 DIAGNOSIS — M542 Cervicalgia: Secondary | ICD-10-CM | POA: Diagnosis not present

## 2017-10-11 DIAGNOSIS — L98499 Non-pressure chronic ulcer of skin of other sites with unspecified severity: Secondary | ICD-10-CM | POA: Diagnosis not present

## 2017-10-11 DIAGNOSIS — M349 Systemic sclerosis, unspecified: Secondary | ICD-10-CM | POA: Diagnosis not present

## 2017-10-11 DIAGNOSIS — Z6822 Body mass index (BMI) 22.0-22.9, adult: Secondary | ICD-10-CM | POA: Diagnosis not present

## 2017-10-11 DIAGNOSIS — I7301 Raynaud's syndrome with gangrene: Secondary | ICD-10-CM | POA: Diagnosis not present

## 2017-10-11 DIAGNOSIS — M7989 Other specified soft tissue disorders: Secondary | ICD-10-CM | POA: Diagnosis not present

## 2017-10-16 ENCOUNTER — Encounter: Payer: Medicare HMO | Admitting: Vascular Surgery

## 2017-10-16 ENCOUNTER — Other Ambulatory Visit (HOSPITAL_COMMUNITY): Payer: Medicare HMO

## 2017-10-18 ENCOUNTER — Ambulatory Visit
Admission: RE | Admit: 2017-10-18 | Discharge: 2017-10-18 | Disposition: A | Discharge status: Home / Self Care | Payer: Medicare HMO | Source: Ambulatory Visit | Attending: Family Medicine | Admitting: Family Medicine

## 2017-10-18 ENCOUNTER — Other Ambulatory Visit: Payer: Self-pay | Admitting: Family Medicine

## 2017-10-18 DIAGNOSIS — R05 Cough: Secondary | ICD-10-CM | POA: Diagnosis not present

## 2017-10-18 DIAGNOSIS — R0602 Shortness of breath: Secondary | ICD-10-CM

## 2017-10-18 DIAGNOSIS — I10 Essential (primary) hypertension: Secondary | ICD-10-CM | POA: Diagnosis not present

## 2017-10-18 DIAGNOSIS — M349 Systemic sclerosis, unspecified: Secondary | ICD-10-CM | POA: Diagnosis not present

## 2017-10-18 DIAGNOSIS — E7800 Pure hypercholesterolemia, unspecified: Secondary | ICD-10-CM | POA: Diagnosis not present

## 2017-10-21 DIAGNOSIS — E1122 Type 2 diabetes mellitus with diabetic chronic kidney disease: Secondary | ICD-10-CM | POA: Diagnosis not present

## 2017-10-21 DIAGNOSIS — E7800 Pure hypercholesterolemia, unspecified: Secondary | ICD-10-CM | POA: Diagnosis not present

## 2017-10-21 DIAGNOSIS — M349 Systemic sclerosis, unspecified: Secondary | ICD-10-CM | POA: Diagnosis not present

## 2017-10-21 DIAGNOSIS — R269 Unspecified abnormalities of gait and mobility: Secondary | ICD-10-CM | POA: Diagnosis not present

## 2017-10-21 DIAGNOSIS — M5382 Other specified dorsopathies, cervical region: Secondary | ICD-10-CM | POA: Diagnosis not present

## 2017-10-21 DIAGNOSIS — N183 Chronic kidney disease, stage 3 (moderate): Secondary | ICD-10-CM | POA: Diagnosis not present

## 2017-10-21 DIAGNOSIS — I129 Hypertensive chronic kidney disease with stage 1 through stage 4 chronic kidney disease, or unspecified chronic kidney disease: Secondary | ICD-10-CM | POA: Diagnosis not present

## 2017-10-21 DIAGNOSIS — I7300 Raynaud's syndrome without gangrene: Secondary | ICD-10-CM | POA: Diagnosis not present

## 2017-10-22 DIAGNOSIS — I7301 Raynaud's syndrome with gangrene: Secondary | ICD-10-CM | POA: Diagnosis not present

## 2017-10-22 DIAGNOSIS — M7989 Other specified soft tissue disorders: Secondary | ICD-10-CM | POA: Diagnosis not present

## 2017-10-22 DIAGNOSIS — M349 Systemic sclerosis, unspecified: Secondary | ICD-10-CM | POA: Diagnosis not present

## 2017-10-22 DIAGNOSIS — L98499 Non-pressure chronic ulcer of skin of other sites with unspecified severity: Secondary | ICD-10-CM | POA: Diagnosis not present

## 2017-10-22 DIAGNOSIS — Z6821 Body mass index (BMI) 21.0-21.9, adult: Secondary | ICD-10-CM | POA: Diagnosis not present

## 2017-10-23 ENCOUNTER — Encounter: Payer: Self-pay | Admitting: Cardiology

## 2017-10-23 DIAGNOSIS — E7800 Pure hypercholesterolemia, unspecified: Secondary | ICD-10-CM | POA: Diagnosis not present

## 2017-10-23 DIAGNOSIS — I129 Hypertensive chronic kidney disease with stage 1 through stage 4 chronic kidney disease, or unspecified chronic kidney disease: Secondary | ICD-10-CM | POA: Diagnosis not present

## 2017-10-23 DIAGNOSIS — I7300 Raynaud's syndrome without gangrene: Secondary | ICD-10-CM | POA: Diagnosis not present

## 2017-10-23 DIAGNOSIS — N183 Chronic kidney disease, stage 3 (moderate): Secondary | ICD-10-CM | POA: Diagnosis not present

## 2017-10-23 DIAGNOSIS — E1122 Type 2 diabetes mellitus with diabetic chronic kidney disease: Secondary | ICD-10-CM | POA: Diagnosis not present

## 2017-10-23 DIAGNOSIS — M5382 Other specified dorsopathies, cervical region: Secondary | ICD-10-CM | POA: Diagnosis not present

## 2017-10-23 DIAGNOSIS — M349 Systemic sclerosis, unspecified: Secondary | ICD-10-CM | POA: Diagnosis not present

## 2017-10-24 ENCOUNTER — Ambulatory Visit (HOSPITAL_COMMUNITY)
Admission: RE | Admit: 2017-10-24 | Discharge: 2017-10-24 | Disposition: A | Discharge status: Home / Self Care | Payer: Medicare HMO | Source: Ambulatory Visit | Attending: Vascular Surgery | Admitting: Vascular Surgery

## 2017-10-24 ENCOUNTER — Ambulatory Visit: Payer: Medicare HMO | Admitting: Internal Medicine

## 2017-10-24 ENCOUNTER — Encounter: Payer: Self-pay | Admitting: Cardiology

## 2017-10-24 ENCOUNTER — Encounter: Payer: Self-pay | Admitting: Internal Medicine

## 2017-10-24 VITALS — BP 116/72 | HR 67 | Ht 60.0 in | Wt 116.8 lb

## 2017-10-24 DIAGNOSIS — R0609 Other forms of dyspnea: Secondary | ICD-10-CM | POA: Diagnosis not present

## 2017-10-24 DIAGNOSIS — E785 Hyperlipidemia, unspecified: Secondary | ICD-10-CM | POA: Insufficient documentation

## 2017-10-24 DIAGNOSIS — E119 Type 2 diabetes mellitus without complications: Secondary | ICD-10-CM | POA: Diagnosis not present

## 2017-10-24 DIAGNOSIS — I7300 Raynaud's syndrome without gangrene: Secondary | ICD-10-CM | POA: Diagnosis not present

## 2017-10-24 DIAGNOSIS — I10 Essential (primary) hypertension: Secondary | ICD-10-CM | POA: Diagnosis not present

## 2017-10-24 MED ORDER — FAMOTIDINE 20 MG PO TABS: ORAL_TABLET | ORAL | 11 refills | Status: AC

## 2017-10-24 MED ORDER — PANTOPRAZOLE SODIUM 40 MG PO TBEC: 40.0000 mg | DELAYED_RELEASE_TABLET | Freq: Every day | ORAL | 2 refills | Status: AC

## 2017-10-24 NOTE — Progress Notes (Signed)
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   10/24/2017 1st [facility] office visit/ Isabel Huff   Chief Complaint  Patient presents with  . Pulmonary Consult    Referred by Isabel Huff. She was dxed with Scleroderma in Jan 2019. She has been losing wt unintentionally.  She has had some DOE for the past few months and cough for the past couple of wks.  She states she gets SOB just walking from room to room at home. Her cough has been non prod.   indolent onset gradually worse doe/ gen weakenss/ instability to where walks now with walker (but did not bring it) and doe to point of room to room gives out assoc with dry cough and loss of appetite but no def solid food dysphagia or choking on food.    No obvious day to day or daytime variability or assoc excess/ purulent sputum or mucus plugs or hemoptysis or cp or chest tightness, subjective wheeze or overt sinus or hb symptoms. No unusual exposure hx or h/o childhood pna/ asthma or knowledge of premature birth.  Sleeping  Ok flat   without nocturnal  or early am exacerbation  of respiratory  c/o's or need for noct saba. Also denies any obvious fluctuation of symptoms with weather or environmental changes or other aggravating or alleviating factors except as outlined above   Current Allergies, Complete Past Medical History, Past Surgical History, Family History, and Social History were reviewed in [facility] electronic medical record.  ROS  The following are not active  complaints unless bolded Hoarseness, sore throat, dysphagia, dental problems, itching, sneezing,  nasal congestion or discharge of excess mucus or purulent secretions, ear ache,   fever, chills, sweats, unintended wt loss or wt gain, classically pleuritic or exertional cp,  orthopnea pnd or arm/hand swelling  or leg swelling, presyncope, palpitations, abdominal pain, anorexia, nausea, vomiting, diarrhea  or change in bowel habits or change in bladder habits, change in stools or change in urine, dysuria, hematuria,  rash, arthralgias, visual complaints, headache, numbness, weakness or ataxia or problems with walking or coordination,  change in mood or  memory        Current Meds  Medication Sig  . amLODipine (NORVASC) 5 MG tablet Take 5 mg by mouth daily.  . nitroGLYCERIN (NITRO-BID) 2 % ointment Apply topically 4 (four) times daily.        Review of Systems     Objective:   Physical Exam    w/c bound wf nad   Wt Readings from Last 3 Encounters:  10/24/17 116 lb 12.8 oz (53 kg)  08/27/17 126 lb (57.2 kg)  08/12/17 126 lb (57.2 kg)     Vital signs reviewed - Note on arrival 02 sats  97 % on RA      HEENT: nl dentition, turbinates bilaterally, and oropharynx. Nl external ear canals without cough reflex   NECK :  without JVD/Nodes/TM/ nl carotid  upstrokes bilaterally   LUNGS: no acc muscle use,  Nl contour chest which is clear to A and P bilaterally without cough on insp or exp maneuvers   CV:  RRR  no s3  II/VI SEM but no iincrease in P2, and no edema   ABD:  soft and nontender with nl inspiratory excursion in the supine position. No bruits or organomegaly appreciated, bowel sounds nl  MS:  Nl gait/ ext warm without deformities, calf tenderness, cyanosis or clubbing No obvious joint restrictions   SKIN: warm and dry with ulcerations tips of sev fingers bilaterally     NEURO:  alert, approp, nl sensorium with  no motor or cerebellar deficits apparent.    I personally  reviewed images and agree with radiology impression as follows:  CXR:   10/20/17 Mild left basilar atelectasis. My review: no significant atx   Assessment:

## 2017-10-24 NOTE — Assessment & Plan Note (Signed)
Echo  08/12/17  Left ventricle: The cavity size was normal. There was mild   concentric hypertrophy. Systolic function was normal. The   estimated ejection fraction was in the range of 60% to 65%. Wall   motion was normal; there were no regional wall motion   abnormalities. Doppler parameters are consistent with abnormal   left ventricular relaxation (grade 1 diastolic dysfunction). - Aortic valve: Trileaflet; severely thickened, severely calcified   leaflets. Valve mobility was restricted. There was moderate   stenosis. Mean gradient (S): 21 mm Hg. Peak gradient (S): 31 mm   Hg. Valve area (VTI): 1.01 cm^2. Valve area (Vmax): 1.01 cm^2.   Valve area (Vmean): 0.9 cm^2. - Mitral valve: There was trivial regurgitation. - Right ventricle: The cavity size was normal. Wall thickness was   normal. Systolic function was normal. - Right atrium: The atrium was normal in size. - Tricuspid valve: There was moderate regurgitation. - Pulmonary arteries: Systolic pressure was within the normal   range.   There is no evidence of significant ILD or PH at this point but very likely this is very aggressive scleroderma and probably has es involvement at well so prone to asp/ dysphagia and may already have cough from gerd so rec  1) max rx for GERD  2) f/u in 4 weeks with pfts    Total time devoted to counseling  > 50 % of initial 60 min office visit:  review case with pt/son discussion of options/alternatives/ personally creating written customized instructions  in presence of pt  then going over those specific  Instructions directly with the pt including how to use all of the meds but in particular covering each new medication in detail and the difference between the maintenance= "automatic" meds and the prns using an action plan format for the latter (If this problem/symptom => do that organization reading Left to right).  Please see AVS from this visit for a full list of these instructions which I  personally wrote for this pt and  are unique to this visit.

## 2017-10-24 NOTE — Patient Instructions (Addendum)
Pantoprazole (protonix) 40 mg   Take  30-60 min before first meal of the day and Pepcid (famotidine)  20 mg one @  bedtime until return to office - this is the best way to tell whether stomach acid is contributing to your problem.     GERD (REFLUX)  is an extremely common cause of respiratory symptoms just like yours , many times with no obvious heartburn at all.    It can be treated with medication, but also with lifestyle changes including elevation of the head of your bed (ideally with 6 inch  bed blocks),  Smoking cessation, avoidance of late meals, excessive alcohol, and avoid fatty foods, chocolate, peppermint, colas, red wine, and acidic juices such as orange juice.  NO MINT OR MENTHOL PRODUCTS SO NO COUGH DROPS   USE SUGARLESS CANDY INSTEAD (Jolley ranchers or Stover's or Life Savers) or even ice chips will also do - the key is to swallow to prevent all throat clearing. NO OIL BASED VITAMINS - use powdered substitutes.    Please schedule a follow up office visit in 4 weeks, sooner if needed with PFT's on return

## 2017-10-25 ENCOUNTER — Encounter: Payer: Self-pay | Admitting: Vascular Surgery

## 2017-10-25 ENCOUNTER — Other Ambulatory Visit: Payer: Self-pay

## 2017-10-25 ENCOUNTER — Ambulatory Visit: Payer: Medicare HMO | Admitting: Vascular Surgery

## 2017-10-25 VITALS — BP 100/62 | HR 55 | Resp 18 | Ht 60.0 in | Wt 118.0 lb

## 2017-10-25 DIAGNOSIS — I7300 Raynaud's syndrome without gangrene: Secondary | ICD-10-CM | POA: Diagnosis not present

## 2017-10-25 DIAGNOSIS — E7800 Pure hypercholesterolemia, unspecified: Secondary | ICD-10-CM | POA: Diagnosis not present

## 2017-10-25 DIAGNOSIS — I129 Hypertensive chronic kidney disease with stage 1 through stage 4 chronic kidney disease, or unspecified chronic kidney disease: Secondary | ICD-10-CM | POA: Diagnosis not present

## 2017-10-25 DIAGNOSIS — N183 Chronic kidney disease, stage 3 (moderate): Secondary | ICD-10-CM | POA: Diagnosis not present

## 2017-10-25 DIAGNOSIS — M349 Systemic sclerosis, unspecified: Secondary | ICD-10-CM | POA: Diagnosis not present

## 2017-10-25 DIAGNOSIS — I7301 Raynaud's syndrome with gangrene: Secondary | ICD-10-CM | POA: Diagnosis not present

## 2017-10-25 DIAGNOSIS — M5382 Other specified dorsopathies, cervical region: Secondary | ICD-10-CM | POA: Diagnosis not present

## 2017-10-25 DIAGNOSIS — E1122 Type 2 diabetes mellitus with diabetic chronic kidney disease: Secondary | ICD-10-CM | POA: Diagnosis not present

## 2017-10-25 NOTE — Progress Notes (Signed)
Referring Physician: Dr Zenovia Jordan  Patient name: Isabel Huff MRN: [id number] DOB: [birth year] Sex: female  REASON FOR CONSULT: consider sympathectomy  HPI: Isabel Huff is a 77 y.o. female, with a several week history of ulcerations the tips of her right digits.  She has a known history of systemic sclerosis and is followed by rheumatology for this.  She has been doing local wound care of the tips of her fingers and they are slowly healing.  She was referred by rheumatology for consideration of sympathectomy for assistance in wound healing.  Other medical problems include chronic kidney disease, elevated cholesterol hypertension diabetes all of which are currently stable.  Past Medical History:  Diagnosis Date  . Abnormal vaginal Pap smear   . Aortic stenosis 08/12/2017  . Chronic kidney disease, stage III (moderate) (HCC)   . Heart murmur    grade 1, diastolic dysfunction, mild Mitral Regurg and Aortic Stenosis  . Hypercholesteremia   . Hypertension   . Nephrolithiasis   . Seasonal allergies   . Type 2 diabetes mellitus (HCC)    History reviewed. No pertinent surgical history.  Family History  Problem Relation Age of Onset  . Alzheimer's disease Father   . Hypertension Sister   . Hyperlipidemia Sister     SOCIAL HISTORY: Social History   Socioeconomic History  . Marital status: Divorced    Spouse name: Not on file  . Number of children: Not on file  . Years of education: Not on file  . Highest education level: Not on file  Occupational History  . Occupation: Childcare     Comment: At [employer]   Social Needs  . Financial resource strain: Not on file  . Food insecurity:    Worry: Not on file    Inability: Not on file  . Transportation needs:    Medical: Not on file    Non-medical: Not on file  Tobacco Use  . Smoking status: Never Smoker  . Smokeless tobacco: Never Used  Substance and Sexual Activity  . Alcohol use: No    Frequency: Never  .  Drug use: No  . Sexual activity: Not on file  Lifestyle  . Physical activity:    Days per week: Not on file    Minutes per session: Not on file  . Stress: Not on file  Relationships  . Social connections:    Talks on phone: Not on file    Gets together: Not on file    Attends religious service: Not on file    Active member of club or organization: Not on file    Attends meetings of clubs or organizations: Not on file    Relationship status: Not on file  . Intimate partner violence:    Fear of current or ex partner: Not on file    Emotionally abused: Not on file    Physically abused: Not on file    Forced sexual activity: Not on file  Other Topics Concern  . Not on file  Social History Narrative  . Not on file    Allergies  Allergen Reactions  . Crestor [Rosuvastatin]     Reaction: Muscle aches   . Demerol [Meperidine]     unknown  . Lovaza [Omega-3-Acid Ethyl Esters]     Hallucinations   . Metformin And Related     Aches/soreness   . Pravastatin     Leg cramps  . Simvastatin     Reaction: Muscle aches   .  Sulfa Antibiotics     unknown  . Latex Rash    Current Outpatient Medications  Medication Sig Dispense Refill  . famotidine (PEPCID) 20 MG tablet One at bedtime 30 tablet 11  . losartan-hydrochlorothiazide (HYZAAR) 50-12.5 MG tablet Take 1 tablet by mouth daily.    . nitroGLYCERIN (NITRO-BID) 2 % ointment Apply topically 4 (four) times daily.    . pantoprazole (PROTONIX) 40 MG tablet Take 1 tablet (40 mg total) by mouth daily. Take 30-60 min before first meal of the day 30 tablet 2  . amLODipine (NORVASC) 5 MG tablet Take 5 mg by mouth daily.     No current facility-administered medications for this visit.     ROS:   General:  No weight loss, Fever, chills  HEENT: No recent headaches, no nasal bleeding, no visual changes, no sore throat  Neurologic: No dizziness, blackouts, seizures. No recent symptoms of stroke or mini- stroke. No recent episodes of  slurred speech, or temporary blindness.  Cardiac: No recent episodes of chest pain/pressure, no shortness of breath at rest.  No shortness of breath with exertion.  Denies history of atrial fibrillation or irregular heartbeat  Vascular: No history of rest pain in feet.  No history of claudication.  No history of non-healing ulcer, No history of DVT   Pulmonary: No home oxygen, no productive cough, no hemoptysis,  No asthma or wheezing  Musculoskeletal:  [ ]  Arthritis, [ ]  Low back pain,  [ ]  Joint pain  Hematologic:No history of hypercoagulable state.  No history of easy bleeding.  No history of anemia  Gastrointestinal: No hematochezia or melena,  No gastroesophageal reflux, no trouble swallowing  Urinary: [X]  chronic Kidney disease, [ ]  on HD - [ ]  MWF or [ ]  TTHS, [ ]  Burning with urination, [ ]  Frequent urination, [ ]  Difficulty urinating;   Skin: No rashes  Psychological: No history of anxiety,  No history of depression   Physical Examination  Vitals:   10/25/17 1454  Resp: 18  SpO2: 99%  Weight: 118 lb (53.5 kg)  Height: 5' (1.524 m)    Body mass index is 23.05 kg/m.  General:  Alert and oriented, no acute distress HEENT: Normal Skin: No rash, tip of third right finger has a 10 mm area of dry eschar the tips of all 4 other digits are intact with no ulceration except for a small area on the hyperthenar aspect of the hand which appears to be healing ulcers are not palpable due to the thickness of the skin Musculoskeletal: No deformity or edema  Neurologic: Upper and lower extremity motor 5/5 and symmetric  DATA:  She had a duplex ultrasound of her hands yesterday which showed all large vessels were patent with obliteration of several of the digital vessels   ASSESSMENT: 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  As far as sympathectomy is concerned we do not perform that procedure in our group practice.  However, even with  sympathectomy recurrence rate of ulcerations with vasospastic diseases as high as 70% so if the wound can heal spontaneously this is the best course of action   PLAN: Continue local wound care I suspect these wounds will eventually heal.  If still wishing to pursue sympathectomy she could be referred for this to the hand surgeons for digital sympathectomy.  I believe Dr. Merlyn Lot may do these.  Additionally the thoracic surgeons in the past have done some thoracic sympathectomies for hyperhidrosis.  However, conservative management would be  the easiest on the patient with the lowest morbidity unless she has a threatened extremity.  Currently I believe this will heal spontaneously.  She will follow-up with me on as-needed basis.   Fabienne Brunsharles Fields, MD [facility] Office: [phone] Pager: [phone]

## 2017-10-28 DIAGNOSIS — I129 Hypertensive chronic kidney disease with stage 1 through stage 4 chronic kidney disease, or unspecified chronic kidney disease: Secondary | ICD-10-CM | POA: Diagnosis not present

## 2017-10-28 DIAGNOSIS — E7800 Pure hypercholesterolemia, unspecified: Secondary | ICD-10-CM | POA: Diagnosis not present

## 2017-10-28 DIAGNOSIS — E1122 Type 2 diabetes mellitus with diabetic chronic kidney disease: Secondary | ICD-10-CM | POA: Diagnosis not present

## 2017-10-28 DIAGNOSIS — N183 Chronic kidney disease, stage 3 (moderate): Secondary | ICD-10-CM | POA: Diagnosis not present

## 2017-10-28 DIAGNOSIS — M349 Systemic sclerosis, unspecified: Secondary | ICD-10-CM | POA: Diagnosis not present

## 2017-10-28 DIAGNOSIS — M5382 Other specified dorsopathies, cervical region: Secondary | ICD-10-CM | POA: Diagnosis not present

## 2017-10-28 DIAGNOSIS — I7300 Raynaud's syndrome without gangrene: Secondary | ICD-10-CM | POA: Diagnosis not present

## 2017-10-29 ENCOUNTER — Ambulatory Visit: Payer: Medicare HMO | Admitting: Sports Medicine

## 2017-10-29 ENCOUNTER — Encounter: Payer: Self-pay | Admitting: Sports Medicine

## 2017-10-29 VITALS — BP 121/74 | HR 77 | Resp 16

## 2017-10-29 DIAGNOSIS — M79609 Pain in unspecified limb: Secondary | ICD-10-CM | POA: Diagnosis not present

## 2017-10-29 DIAGNOSIS — E1100 Type 2 diabetes mellitus with hyperosmolarity without nonketotic hyperglycemic-hyperosmolar coma (NKHHC): Secondary | ICD-10-CM

## 2017-10-29 DIAGNOSIS — I7300 Raynaud's syndrome without gangrene: Secondary | ICD-10-CM | POA: Diagnosis not present

## 2017-10-29 DIAGNOSIS — B351 Tinea unguium: Secondary | ICD-10-CM

## 2017-10-29 NOTE — Progress Notes (Signed)
Subjective: Jerelyn CharlesVilma R Needle is a 77 y.o. female patient with history of diabetes who presents to office today complaining of long,mildly painful nails  while ambulating in shoes; unable to trim. Patient states that the glucose reading this morning was not recorded. Patient denies any new changes in medication or new problems. Patient denies any new cramping, numbness, burning or tingling in the legs. Admits to history of Raynauds and gangrene on finger tips.   Review of Systems  All other systems reviewed and are negative.    Patient Active Problem List   Diagnosis Date Noted  . DOE (dyspnea on exertion) 10/24/2017  . Aortic stenosis 08/12/2017  . Other chest pain 08/06/2017  . Hypertension 08/06/2017  . Type 2 diabetes mellitus (HCC) 08/06/2017  . Hypercholesteremia 08/06/2017  . Chronic kidney disease (CKD), stage III (moderate) (HCC) 08/06/2017  . Seasonal allergies 08/06/2017  . Abnormal vaginal Pap smear 08/06/2017  . Nephrolithiasis 08/06/2017   Current Outpatient Medications on File Prior to Visit  Medication Sig Dispense Refill  . amLODipine (NORVASC) 5 MG tablet Take 5 mg by mouth daily.    . famotidine (PEPCID) 20 MG tablet One at bedtime 30 tablet 11  . losartan-hydrochlorothiazide (HYZAAR) 50-12.5 MG tablet Take 1 tablet by mouth daily.    . nitroGLYCERIN (NITRO-BID) 2 % ointment Apply topically 4 (four) times daily.    . pantoprazole (PROTONIX) 40 MG tablet Take 1 tablet (40 mg total) by mouth daily. Take 30-60 min before first meal of the day 30 tablet 2   No current facility-administered medications on file prior to visit.    Allergies  Allergen Reactions  . Crestor [Rosuvastatin]     Reaction: Muscle aches   . Demerol [Meperidine]     unknown  . Lovaza [Omega-3-Acid Ethyl Esters]     Hallucinations   . Metformin And Related     Aches/soreness   . Pravastatin     Leg cramps  . Simvastatin     Reaction: Muscle aches   . Sulfa Antibiotics     unknown  .  Latex Rash    Recent Results (from the past 2160 hour(s))  Antinuclear Antib (ANA)     Status: None   Collection Time: 08/12/17  3:14 PM  Result Value Ref Range   Anit Nuclear Antibody(ANA) Negative Negative  Rheumatoid Factor     Status: None   Collection Time: 08/12/17  3:14 PM  Result Value Ref Range   Rhuematoid fact SerPl-aCnc <10.0 0.0 - 13.9 IU/mL  C-reactive protein     Status: None   Collection Time: 08/12/17  3:14 PM  Result Value Ref Range   CRP 2.1 0.0 - 4.9 mg/L  Sedimentation rate     Status: None   Collection Time: 08/12/17  3:14 PM  Result Value Ref Range   Sed Rate 32 0 - 40 mm/hr  ECHOCARDIOGRAM COMPLETE     Status: None   Collection Time: 08/12/17  4:06 PM  Result Value Ref Range   Weight 2,016 oz   Height 62.000 in  MYOCARDIAL PERFUSION IMAGING     Status: None   Collection Time: 08/27/17 10:24 AM  Result Value Ref Range   Rest HR 116 bpm   Rest BP 122/94 mmHg   Peak HR 130 bpm   Peak BP 127/96 mmHg   SSS 5    SRS 3    SDS 2    LHR 0.34    TID 1.07     Objective: General: Patient  is awake, alert, and oriented x 3 and in no acute distress.  Integument: Skin is warm, dry and supple bilateral. Nails are tender, long, thickened and dystrophic with subungual debris, consistent with onychomycosis, 1-5 bilateral. No signs of infection. + Callus/ preulcerative lesions present left 2-3 toes with dry callus/scab. Remaining integument unremarkable.  Vasculature:  Dorsalis Pedis pulse 1/4 bilateral. Posterior Tibial pulse  0/4 bilateral. Capillary fill time <5 sec 1-5 bilateral. No hair growth to the level of the digits.Temperature gradient within normal limits no acute pedal gangrene or ischemia or rest pain. No varicosities present bilateral. No edema present bilateral.   Neurology: The patient has diminished sensation measured with a 5.07/10g Semmes Weinstein Monofilament at all pedal sites bilateral.    Musculoskeletal: Asymptomatic hammertoe pedal  deformities noted bilateral. Muscular strength 5/5 in all lower extremity muscular groups bilateral without pain on range of motion. No tenderness with calf compression bilateral.  Assessment and Plan: Problem List Items Addressed This Visit      Endocrine   Type 2 diabetes mellitus (HCC)    Other Visit Diagnoses    Pain due to onychomycosis of nail    -  Primary   Raynaud's disease without gangrene          -Examined patient. -Discussed and educated patient on diabetic foot care, especially with  regards to the vascular, neurological and musculoskeletal systems.  -Stressed the importance of good glycemic control and the detriment of not  controlling glucose levels in relation to the foot. -Mechanically debrided all nails 1-5 bilateral using sterile nail nipper and filed with dremel without incident  -Advised close monitoring of corns to toes and feet to ensure that she doesn't get gangrene like she has on her fingers -Recommend good supportive shoes to prevent irritation to toes  -Answered all patient questions -Patient to return  in 3 months for at risk foot care -Patient advised to call the office if any problems or questions arise in the meantime.  Asencion Islam, DPM

## 2017-10-30 DIAGNOSIS — E7800 Pure hypercholesterolemia, unspecified: Secondary | ICD-10-CM | POA: Diagnosis not present

## 2017-10-30 DIAGNOSIS — N183 Chronic kidney disease, stage 3 (moderate): Secondary | ICD-10-CM | POA: Diagnosis not present

## 2017-10-30 DIAGNOSIS — M5382 Other specified dorsopathies, cervical region: Secondary | ICD-10-CM | POA: Diagnosis not present

## 2017-10-30 DIAGNOSIS — E1122 Type 2 diabetes mellitus with diabetic chronic kidney disease: Secondary | ICD-10-CM | POA: Diagnosis not present

## 2017-10-30 DIAGNOSIS — M349 Systemic sclerosis, unspecified: Secondary | ICD-10-CM | POA: Diagnosis not present

## 2017-10-30 DIAGNOSIS — I7300 Raynaud's syndrome without gangrene: Secondary | ICD-10-CM | POA: Diagnosis not present

## 2017-10-30 DIAGNOSIS — I129 Hypertensive chronic kidney disease with stage 1 through stage 4 chronic kidney disease, or unspecified chronic kidney disease: Secondary | ICD-10-CM | POA: Diagnosis not present

## 2017-10-31 DIAGNOSIS — I7300 Raynaud's syndrome without gangrene: Secondary | ICD-10-CM | POA: Diagnosis not present

## 2017-10-31 DIAGNOSIS — E7800 Pure hypercholesterolemia, unspecified: Secondary | ICD-10-CM | POA: Diagnosis not present

## 2017-10-31 DIAGNOSIS — E1122 Type 2 diabetes mellitus with diabetic chronic kidney disease: Secondary | ICD-10-CM | POA: Diagnosis not present

## 2017-10-31 DIAGNOSIS — I129 Hypertensive chronic kidney disease with stage 1 through stage 4 chronic kidney disease, or unspecified chronic kidney disease: Secondary | ICD-10-CM | POA: Diagnosis not present

## 2017-10-31 DIAGNOSIS — N183 Chronic kidney disease, stage 3 (moderate): Secondary | ICD-10-CM | POA: Diagnosis not present

## 2017-10-31 DIAGNOSIS — M5382 Other specified dorsopathies, cervical region: Secondary | ICD-10-CM | POA: Diagnosis not present

## 2017-10-31 DIAGNOSIS — M349 Systemic sclerosis, unspecified: Secondary | ICD-10-CM | POA: Diagnosis not present

## 2017-11-04 DIAGNOSIS — M5382 Other specified dorsopathies, cervical region: Secondary | ICD-10-CM | POA: Diagnosis not present

## 2017-11-04 DIAGNOSIS — N183 Chronic kidney disease, stage 3 (moderate): Secondary | ICD-10-CM | POA: Diagnosis not present

## 2017-11-04 DIAGNOSIS — M349 Systemic sclerosis, unspecified: Secondary | ICD-10-CM | POA: Diagnosis not present

## 2017-11-04 DIAGNOSIS — I129 Hypertensive chronic kidney disease with stage 1 through stage 4 chronic kidney disease, or unspecified chronic kidney disease: Secondary | ICD-10-CM | POA: Diagnosis not present

## 2017-11-04 DIAGNOSIS — E1122 Type 2 diabetes mellitus with diabetic chronic kidney disease: Secondary | ICD-10-CM | POA: Diagnosis not present

## 2017-11-04 DIAGNOSIS — E7800 Pure hypercholesterolemia, unspecified: Secondary | ICD-10-CM | POA: Diagnosis not present

## 2017-11-04 DIAGNOSIS — I7300 Raynaud's syndrome without gangrene: Secondary | ICD-10-CM | POA: Diagnosis not present

## 2017-11-06 DIAGNOSIS — M349 Systemic sclerosis, unspecified: Secondary | ICD-10-CM | POA: Diagnosis not present

## 2017-11-06 DIAGNOSIS — E7800 Pure hypercholesterolemia, unspecified: Secondary | ICD-10-CM | POA: Diagnosis not present

## 2017-11-06 DIAGNOSIS — M5382 Other specified dorsopathies, cervical region: Secondary | ICD-10-CM | POA: Diagnosis not present

## 2017-11-06 DIAGNOSIS — E1122 Type 2 diabetes mellitus with diabetic chronic kidney disease: Secondary | ICD-10-CM | POA: Diagnosis not present

## 2017-11-06 DIAGNOSIS — N183 Chronic kidney disease, stage 3 (moderate): Secondary | ICD-10-CM | POA: Diagnosis not present

## 2017-11-06 DIAGNOSIS — I7300 Raynaud's syndrome without gangrene: Secondary | ICD-10-CM | POA: Diagnosis not present

## 2017-11-06 DIAGNOSIS — I129 Hypertensive chronic kidney disease with stage 1 through stage 4 chronic kidney disease, or unspecified chronic kidney disease: Secondary | ICD-10-CM | POA: Diagnosis not present

## 2017-11-08 DIAGNOSIS — I129 Hypertensive chronic kidney disease with stage 1 through stage 4 chronic kidney disease, or unspecified chronic kidney disease: Secondary | ICD-10-CM | POA: Diagnosis not present

## 2017-11-08 DIAGNOSIS — I7300 Raynaud's syndrome without gangrene: Secondary | ICD-10-CM | POA: Diagnosis not present

## 2017-11-08 DIAGNOSIS — N183 Chronic kidney disease, stage 3 (moderate): Secondary | ICD-10-CM | POA: Diagnosis not present

## 2017-11-08 DIAGNOSIS — E1122 Type 2 diabetes mellitus with diabetic chronic kidney disease: Secondary | ICD-10-CM | POA: Diagnosis not present

## 2017-11-08 DIAGNOSIS — M5382 Other specified dorsopathies, cervical region: Secondary | ICD-10-CM | POA: Diagnosis not present

## 2017-11-08 DIAGNOSIS — E7800 Pure hypercholesterolemia, unspecified: Secondary | ICD-10-CM | POA: Diagnosis not present

## 2017-11-08 DIAGNOSIS — M349 Systemic sclerosis, unspecified: Secondary | ICD-10-CM | POA: Diagnosis not present

## 2017-11-11 ENCOUNTER — Encounter: Payer: Self-pay | Admitting: Neurology

## 2017-11-11 DIAGNOSIS — M5382 Other specified dorsopathies, cervical region: Secondary | ICD-10-CM | POA: Diagnosis not present

## 2017-11-11 DIAGNOSIS — M349 Systemic sclerosis, unspecified: Secondary | ICD-10-CM | POA: Diagnosis not present

## 2017-11-11 DIAGNOSIS — E7800 Pure hypercholesterolemia, unspecified: Secondary | ICD-10-CM | POA: Diagnosis not present

## 2017-11-11 DIAGNOSIS — N183 Chronic kidney disease, stage 3 (moderate): Secondary | ICD-10-CM | POA: Diagnosis not present

## 2017-11-11 DIAGNOSIS — E1122 Type 2 diabetes mellitus with diabetic chronic kidney disease: Secondary | ICD-10-CM | POA: Diagnosis not present

## 2017-11-11 DIAGNOSIS — I129 Hypertensive chronic kidney disease with stage 1 through stage 4 chronic kidney disease, or unspecified chronic kidney disease: Secondary | ICD-10-CM | POA: Diagnosis not present

## 2017-11-11 DIAGNOSIS — I7300 Raynaud's syndrome without gangrene: Secondary | ICD-10-CM | POA: Diagnosis not present

## 2017-11-13 DIAGNOSIS — E7800 Pure hypercholesterolemia, unspecified: Secondary | ICD-10-CM | POA: Diagnosis not present

## 2017-11-13 DIAGNOSIS — I129 Hypertensive chronic kidney disease with stage 1 through stage 4 chronic kidney disease, or unspecified chronic kidney disease: Secondary | ICD-10-CM | POA: Diagnosis not present

## 2017-11-13 DIAGNOSIS — N183 Chronic kidney disease, stage 3 (moderate): Secondary | ICD-10-CM | POA: Diagnosis not present

## 2017-11-13 DIAGNOSIS — M5382 Other specified dorsopathies, cervical region: Secondary | ICD-10-CM | POA: Diagnosis not present

## 2017-11-13 DIAGNOSIS — I7300 Raynaud's syndrome without gangrene: Secondary | ICD-10-CM | POA: Diagnosis not present

## 2017-11-13 DIAGNOSIS — M349 Systemic sclerosis, unspecified: Secondary | ICD-10-CM | POA: Diagnosis not present

## 2017-11-13 DIAGNOSIS — E1122 Type 2 diabetes mellitus with diabetic chronic kidney disease: Secondary | ICD-10-CM | POA: Diagnosis not present

## 2017-11-28 ENCOUNTER — Other Ambulatory Visit (HOSPITAL_COMMUNITY): Payer: Medicare HMO

## 2017-11-28 ENCOUNTER — Encounter: Payer: Medicare HMO | Admitting: Vascular Surgery

## 2017-11-29 ENCOUNTER — Ambulatory Visit: Payer: Medicare HMO | Admitting: Internal Medicine

## 2017-12-14 DEATH — deceased

## 2018-01-14 ENCOUNTER — Other Ambulatory Visit: Payer: Self-pay | Admitting: Internal Medicine

## 2018-02-21 ENCOUNTER — Ambulatory Visit: Payer: Medicare HMO | Admitting: Neurology
